# Patient Record
Sex: Male | Born: 2000 | Race: White | Hispanic: No | Marital: Single | Smoking: Current every day smoker
Health system: Southern US, Community
[De-identification: ages and names within clinical notes are randomized; demographics above are authoritative.]

## PROBLEM LIST (undated history)

## (undated) DIAGNOSIS — T7840XA Allergy, unspecified, initial encounter: Secondary | ICD-10-CM

## (undated) DIAGNOSIS — G43909 Migraine, unspecified, not intractable, without status migrainosus: Secondary | ICD-10-CM

## (undated) DIAGNOSIS — M419 Scoliosis, unspecified: Secondary | ICD-10-CM

## (undated) DIAGNOSIS — N281 Cyst of kidney, acquired: Secondary | ICD-10-CM

## (undated) DIAGNOSIS — G8929 Other chronic pain: Secondary | ICD-10-CM

## (undated) DIAGNOSIS — F329 Major depressive disorder, single episode, unspecified: Secondary | ICD-10-CM

## (undated) DIAGNOSIS — F32A Depression, unspecified: Secondary | ICD-10-CM

## (undated) DIAGNOSIS — M549 Dorsalgia, unspecified: Secondary | ICD-10-CM

## (undated) DIAGNOSIS — G43509 Persistent migraine aura without cerebral infarction, not intractable, without status migrainosus: Secondary | ICD-10-CM

## (undated) DIAGNOSIS — I10 Essential (primary) hypertension: Secondary | ICD-10-CM

## (undated) DIAGNOSIS — F419 Anxiety disorder, unspecified: Secondary | ICD-10-CM

## (undated) HISTORY — DX: Essential (primary) hypertension: I10

## (undated) HISTORY — DX: Other chronic pain: G89.29

## (undated) HISTORY — DX: Persistent migraine aura without cerebral infarction, not intractable, without status migrainosus: G43.509

## (undated) HISTORY — DX: Major depressive disorder, single episode, unspecified: F32.9

## (undated) HISTORY — DX: Scoliosis, unspecified: M41.9

## (undated) HISTORY — DX: Migraine, unspecified, not intractable, without status migrainosus: G43.909

## (undated) HISTORY — DX: Anxiety disorder, unspecified: F41.9

## (undated) HISTORY — DX: Depression, unspecified: F32.A

## (undated) HISTORY — DX: Dorsalgia, unspecified: M54.9

## (undated) HISTORY — DX: Allergy, unspecified, initial encounter: T78.40XA

## (undated) HISTORY — DX: Cyst of kidney, acquired: N28.1

---
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
## 2018-01-26 ENCOUNTER — Ambulatory Visit: Payer: Self-pay | Admitting: Family Medicine

## 2018-02-08 ENCOUNTER — Encounter: Payer: Self-pay | Admitting: Emergency Medicine

## 2018-02-08 ENCOUNTER — Other Ambulatory Visit: Payer: Self-pay

## 2018-02-08 ENCOUNTER — Emergency Department
Admission: EM | Admit: 2018-02-08 | Discharge: 2018-02-08 | Disposition: A | Discharge status: Home / Self Care | Payer: Medicaid Other | Attending: Emergency Medicine | Admitting: Emergency Medicine

## 2018-02-08 DIAGNOSIS — K047 Periapical abscess without sinus: Secondary | ICD-10-CM | POA: Diagnosis not present

## 2018-02-08 DIAGNOSIS — F17200 Nicotine dependence, unspecified, uncomplicated: Secondary | ICD-10-CM | POA: Insufficient documentation

## 2018-02-08 DIAGNOSIS — Z79899 Other long term (current) drug therapy: Secondary | ICD-10-CM | POA: Insufficient documentation

## 2018-02-08 DIAGNOSIS — I10 Essential (primary) hypertension: Secondary | ICD-10-CM | POA: Insufficient documentation

## 2018-02-08 MED ORDER — AMOXICILLIN 875 MG PO TABS: 875.0000 mg | ORAL_TABLET | Freq: Two times a day (BID) | ORAL | 0 refills | Status: DC

## 2018-02-08 NOTE — Discharge Instructions (Signed)
Follow-up with your regular doctor if not better in 3-5 days.  Use medication as prescribed.  Gargle with warm salt water.  If you are worsening please return the emergency department °

## 2018-02-08 NOTE — ED Provider Notes (Signed)
[facility] Emergency Department Provider Note  ____________________________________________   First MD Initiated Contact with Patient 02/08/18 1614     (approximate)  I have reviewed the triage vital signs and the nursing notes.   HISTORY  Chief Complaint No chief complaint on file.    HPI Troy Quinn is a 17 y.o. male presents to the emergency department complaining of a red area in the roof of the mouth.  He states he thinks he has a abscess where his tooth is coming in.  He denies any fever chills.  Denies any hot foods that may have burned the roof of his mouth.  He denies any chest pain, shortness of breath, vomiting or diarrhea  Past Medical History:  Diagnosis Date  . Allergy    seasonal  . Anxiety   . Chronic back pain   . Cyst of left kidney    patient has it checked every 6 months  . Depression   . Hypertension   . Migraine   . 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 Scoliosis deformity of spine     Patient Active Problem List   Diagnosis Date Noted  . 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 Allergic rhinitis 02/08/2017  . Insomnia 07/26/2016  . Fear of death 06/27/2016  . Abdominal pain 06/25/2016  . GERD without esophagitis 10/07/2015  . EKG abnormalities 10/07/2015  . Cyst of left kidney 07/30/2015  . 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 Midline thoracic back pain 07/30/2015    History reviewed. No pertinent surgical history.  Prior to Admission medications   Medication Sig Start Date End Date Taking? Authorizing Provider  amoxicillin (AMOXIL) 875 MG tablet Take 1 tablet (875 mg total) by mouth 2 (two) times daily. 02/08/18   Lovelee Forner, Roselyn BeringSusan W, PA-C  cyclobenzaprine (FLEXERIL) 5 MG tablet Take 1 tablet (5 mg total) by mouth every 8 (eight) hours as needed for muscle spasms. Do not drive for 8 hours after taking pill 12/23/17   Lada, Janit BernMelinda P, MD  rizatriptan (MAXALT-MLT) 10 MG  disintegrating tablet Take 1 tablet (10 mg total) by mouth as needed for migraine. One dose per 24 hours max 07/15/17   Doren CustardBoyce, Emily E, FNP  sertraline (ZOLOFT) 25 MG tablet Take 1 tablet (25 mg total) by mouth daily. 12/23/17   Kerman PasseyLada, Melinda P, MD    Allergies Prozac [fluoxetine hcl]  Family History  Problem Relation Age of Onset  . Depression Mother   . Asthma Father        exercise induced  . Depression Father   . Hypertension Father        history of   . Cancer Maternal Grandmother        cancer  . Arthritis Maternal Grandfather   . Diabetes Paternal Grandfather   . Hypertension Paternal Grandfather   . Kidney disease Brother   . Cancer Maternal Aunt        breast    Social History Social History   Tobacco Use  . Smoking status: Current Every Day Smoker    Types: E-cigarettes, Cigarettes  . Smokeless tobacco: Never Used  Substance Use Topics  . Alcohol use: No    Alcohol/week: 0.0 oz  . Drug use: No    Comment: history of     Review of Systems  Constitutional: No fever/chills Eyes: No visual changes. ENT: No sore throat.  Positive for a red tender swollen area on the roof of the  mouth Respiratory: Denies cough Genitourinary: Negative for dysuria. Musculoskeletal: Negative for back pain. Skin: Negative for rash.    ____________________________________________   PHYSICAL EXAM:  VITAL SIGNS: ED Triage Vitals  Enc Vitals Group     BP 02/08/18 1616 129/77     Pulse Rate 02/08/18 1616 91     Resp 02/08/18 1616 16     Temp 02/08/18 1616 98.6 F (37 C)     Temp Source 02/08/18 1616 Oral     SpO2 02/08/18 1616 100 %     Weight 02/08/18 1616 170 lb (77.1 kg)     Height 02/08/18 1616 6\' 1"  (1.854 m)     Head Circumference --      Peak Flow --      Pain Score 02/08/18 1621 7     Pain Loc --      Pain Edu? --      Excl. in GC? --     Constitutional: Alert and oriented. Well appearing and in no acute distress. Eyes: Conjunctivae are normal.  Head:  Atraumatic. Nose: No congestion/rhinnorhea. Mouth/Throat: Mucous membranes are moist.  There is a red swollen area in the left upper roof of the mouth adjacent to the molars.  There is no pus noted.  There is no ulcerations noted Neck: Supple, no lymphadenopathy is noted Cardiovascular: Normal rate, regular rhythm.  Heart sounds are normal Respiratory: Normal respiratory effort.  No retractions, lungs clear to auscultation GU: deferred Musculoskeletal: FROM all extremities, warm and well perfused Neurologic:  Normal speech and language.  Skin:  Skin is warm, dry and intact. No rash noted. Psychiatric: Mood and affect are normal. Speech and behavior are normal.  ____________________________________________   LABS (all labs ordered are listed, but only abnormal results are displayed)  Labs Reviewed - No data to display ____________________________________________   ____________________________________________  RADIOLOGY    ____________________________________________   PROCEDURES  Procedure(s) performed: No  Procedures    ____________________________________________   INITIAL IMPRESSION / ASSESSMENT AND PLAN / ED COURSE  Pertinent labs & imaging results that were available during my care of the patient were reviewed by me and considered in my medical decision making (see chart for details).  Patient is a 17 year old male presented emergency department complaining of redness and swelling in the refer the mouth.  On physical exam the area in the roof of the mouth is red swollen and tender.  There are no ulcerations or pustules noted.  Diagnosis is dental abscess.  Patient was given a prescription for amoxicillin 875 twice daily for 10 days.  He is to gargle with warm salt water to soothe the area.  If he is worsening he should return the emergency department.  The patient states he understands and was discharged in stable condition     As part of my medical  decision making, I reviewed the following data within the electronic MEDICAL RECORD NUMBER Nursing notes reviewed and incorporated, Notes from prior ED visits and [place] Controlled Substance Database  ____________________________________________   FINAL CLINICAL IMPRESSION(S) / ED DIAGNOSES  Final diagnoses:  Dental abscess      NEW MEDICATIONS STARTED DURING THIS VISIT:  Discharge Medication List as of 02/08/2018  4:25 PM    START taking these medications   Details  amoxicillin (AMOXIL) 875 MG tablet Take 1 tablet (875 mg total) by mouth 2 (two) times daily., Starting Wed 02/08/2018, Print         Note:  This document was prepared using Dragon voice recognition  software and may include unintentional dictation errors.    Faythe Ghee, PA-C 02/08/18 1704    Phineas Semen, MD 02/08/18 [phone]

## 2018-02-08 NOTE — ED Triage Notes (Signed)
Presents with possible area to roof of mouth

## 2018-05-24 ENCOUNTER — Telehealth: Payer: Self-pay | Admitting: Family Medicine

## 2018-05-24 NOTE — Telephone Encounter (Signed)
Called [phone] and left voice message informing pt that Dr Sherie DonLada refilled his medication however, we are asking that he give the office a call to schedule an appt for the first week in august.

## 2018-05-24 NOTE — Telephone Encounter (Signed)
Please ask patient to schedule a 6 month f/u appointment around the first week of August I'll refill his medicine Thank you

## 2018-10-03 ENCOUNTER — Encounter: Payer: Self-pay | Admitting: Urology

## 2018-10-03 ENCOUNTER — Ambulatory Visit: Payer: Medicaid Other | Admitting: Urology

## 2019-05-25 ENCOUNTER — Other Ambulatory Visit: Payer: Self-pay | Admitting: Family Medicine

## 2019-05-27 NOTE — Telephone Encounter (Signed)
Please schedule patient for follow up in the next 60 days.  

## 2019-05-28 NOTE — Telephone Encounter (Signed)
lvm to schedule appt in next 60 days

## 2021-03-15 ENCOUNTER — Encounter: Payer: Self-pay | Admitting: *Deleted

## 2021-03-15 ENCOUNTER — Emergency Department
Admission: EM | Admit: 2021-03-15 | Discharge: 2021-03-15 | Disposition: A | Discharge status: Home / Self Care | Payer: Medicaid Other | Attending: Physician Assistant | Admitting: Physician Assistant

## 2021-03-15 ENCOUNTER — Other Ambulatory Visit: Payer: Self-pay

## 2021-03-15 DIAGNOSIS — K0889 Other specified disorders of teeth and supporting structures: Secondary | ICD-10-CM | POA: Diagnosis not present

## 2021-03-15 DIAGNOSIS — F17210 Nicotine dependence, cigarettes, uncomplicated: Secondary | ICD-10-CM | POA: Diagnosis not present

## 2021-03-15 DIAGNOSIS — K0499 Other diseases of pulp and periapical tissues: Secondary | ICD-10-CM | POA: Insufficient documentation

## 2021-03-15 DIAGNOSIS — K061 Gingival enlargement: Secondary | ICD-10-CM | POA: Diagnosis not present

## 2021-03-15 DIAGNOSIS — I10 Essential (primary) hypertension: Secondary | ICD-10-CM | POA: Diagnosis not present

## 2021-03-15 MED ORDER — NAPROXEN 500 MG PO TABS: 500.0000 mg | ORAL_TABLET | Freq: Two times a day (BID) | ORAL | Status: DC

## 2021-03-15 MED ORDER — AMOXICILLIN 875 MG PO TABS: 875.0000 mg | ORAL_TABLET | Freq: Two times a day (BID) | ORAL | 0 refills | Status: DC

## 2021-03-15 NOTE — ED Provider Notes (Signed)
[facility] Emergency Department Provider Note   ____________________________________________   Event Date/Time   First MD Initiated Contact with Patient 03/15/21 1101     (approximate)  I have reviewed the triage vital signs and the nursing notes.   HISTORY  Chief Complaint Oral Swelling    HPI Troy Quinn is a 20 y.o. male patient complain of left upper molar pain and gingival edema.  Patient has a longtime history of  left upper molar pain secondary to the devitalized teeth.  Patient states 2 days ago with development gingiva edema.  No fever associated with complaint.  Rates pain as a 4/10.  Described pain as "sore".  No palliative measure for complaint.      Past Medical History:  Diagnosis Date  . Allergy    seasonal  . Anxiety   . Chronic back pain   . Cyst of left kidney    patient has it checked every 6 months  . Depression   . Hypertension   . Migraine   . 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 Scoliosis deformity of spine     Patient Active Problem List   Diagnosis Date Noted  . 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 Allergic rhinitis 02/08/2017  . Insomnia 2016-08-18  . Fear of death July 20, 2016  . Abdominal pain 06/25/2016  . GERD without esophagitis 10/07/2015  . EKG abnormalities 10/07/2015  . Cyst of left kidney 07/30/2015  . 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 Midline thoracic back pain 07/30/2015    History reviewed. No pertinent surgical history.  Prior to Admission medications   Medication Sig Start Date End Date Taking? Authorizing Provider  amoxicillin (AMOXIL) 875 MG tablet Take 1 tablet (875 mg total) by mouth 2 (two) times daily. 03/15/21  Yes Joni Reining, PA-C  naproxen (NAPROSYN) 500 MG tablet Take 1 tablet (500 mg total) by mouth 2 (two) times daily with a meal. 03/15/21  Yes Joni Reining, PA-C  amoxicillin (AMOXIL) 875 MG tablet Take 1 tablet (875  mg total) by mouth 2 (two) times daily. 02/08/18   Fisher, Roselyn Bering, PA-C  cyclobenzaprine (FLEXERIL) 5 MG tablet Take 1 tablet (5 mg total) by mouth every 8 (eight) hours as needed for muscle spasms. Do not drive for 8 hours after taking pill 12/23/17   Lada, Janit Bern, MD  rizatriptan (MAXALT-MLT) 10 MG disintegrating tablet Take 1 tablet (10 mg total) by mouth as needed for migraine. One dose per 24 hours max 07/15/17   Doren Custard, FNP  sertraline (ZOLOFT) 25 MG tablet TAKE 1 TABLET BY MOUTH EVERY DAY 05/27/19   Doren Custard, FNP    Allergies Prozac [fluoxetine hcl]  Family History  Problem Relation Age of Onset  . Depression Mother   . Asthma Father        exercise induced  . Depression Father   . Hypertension Father        history of   . Cancer Maternal Grandmother        cancer  . Arthritis Maternal Grandfather   . Diabetes Paternal Grandfather   . Hypertension Paternal Grandfather   . Kidney disease Brother   . Cancer Maternal Aunt        breast    Social History Social History   Tobacco Use  . Smoking status: Current Every Day Smoker    Types: E-cigarettes, Cigarettes  . Smokeless tobacco: Never Used  Vaping Use  .  Vaping Use: Every day  Substance Use Topics  . Alcohol use: No    Alcohol/week: 0.0 standard drinks  . Drug use: No    Comment: history of     Review of Systems Constitutional: No fever/chills Eyes: No visual changes. ENT: No sore throat.  Dental pain Cardiovascular: Denies chest pain. Respiratory: Denies shortness of breath. Gastrointestinal: No abdominal pain.  No nausea, no vomiting.  No diarrhea.  No constipation. Genitourinary: Negative for dysuria. Musculoskeletal: Negative for back pain. Skin: Negative for rash. Neurological: Negative for headaches, focal weakness or numbness. Psychiatric:  Anxiety Endocrine:  Hypertension Hematological/Lymphatic:  Allergic/Immunilogical:  Prozac  ____________________________________________   PHYSICAL EXAM:  VITAL SIGNS: ED Triage Vitals  Enc Vitals Group     BP 03/15/21 1105 (!) 154/78     Pulse Rate 03/15/21 1105 92     Resp 03/15/21 1105 16     Temp 03/15/21 1105 98.7 F (37.1 C)     Temp Source 03/15/21 1105 Oral     SpO2 03/15/21 1105 98 %     Weight 03/15/21 1106 169 lb 15.6 oz (77.1 kg)     Height 03/15/21 1106 6' (1.829 m)     Head Circumference --      Peak Flow --      Pain Score 03/15/21 1106 4     Pain Loc --      Pain Edu? --      Excl. in GC? --     Constitutional: Alert and oriented. Well appearing and in no acute distress. Mouth/Throat: Mucous membranes are moist.  Oropharynx non-erythematous.  Devitalized left upper molar.  Gingival edema. Neck: No stridor.  No cervical lymphadenopathy. Cardiovascular: Normal rate, regular rhythm. Grossly normal heart sounds.  Good peripheral circulation.  Elevated blood pressure. Respiratory: Normal respiratory effort.  No retractions. Lungs CTAB. Skin:  Skin is warm, dry and intact. No rash noted. Psychiatric: Mood and affect are normal. Speech and behavior are normal.  ____________________________________________   LABS (all labs ordered are listed, but only abnormal results are displayed)  Labs Reviewed - No data to display ____________________________________________  EKG   ____________________________________________  RADIOLOGY I, Joni Reining, personally viewed and evaluated these images (plain radiographs) as part of my medical decision making, as well as reviewing the written report by the radiologist.  ED MD interpretation:    Official radiology report(s): No results found.  ____________________________________________   PROCEDURES  Procedure(s) performed (including Critical Care):  Procedures   ____________________________________________   INITIAL IMPRESSION / ASSESSMENT AND PLAN / ED COURSE  As part of my medical  decision making, I reviewed the following data within the electronic MEDICAL RECORD NUMBER         Patient presents with left foot dental pain secondary to devitalized tooth.  Patient given discharge care instruction advised take medication as directed.  Patient advised to follow-up with dental clinics listed in discharge care instructions.      ____________________________________________   FINAL CLINICAL IMPRESSION(S) / ED DIAGNOSES  Final diagnoses:  Pain, dental     ED Discharge Orders         Ordered    amoxicillin (AMOXIL) 875 MG tablet  2 times daily        03/15/21 1114    naproxen (NAPROSYN) 500 MG tablet  2 times daily with meals        03/15/21 1114          *Please note:  Venetia Maxon was evaluated in Emergency  Department on 03/15/2021 for the symptoms described in the history of present illness. He was evaluated in the context of the global COVID-19 pandemic, which necessitated consideration that the patient might be at risk for infection with the SARS-CoV-2 virus that causes COVID-19. Institutional protocols and algorithms that pertain to the evaluation of patients at risk for COVID-19 are in a state of rapid change based on information released by regulatory bodies including the CDC and federal and state organizations. These policies and algorithms were followed during the patient's care in the ED.  Some ED evaluations and interventions may be delayed as a result of limited staffing during and the pandemic.*   Note:  This document was prepared using Dragon voice recognition software and may include unintentional dictation errors.    Joni Reining, PA-C 03/15/21 1127    Delton Prairie, MD 03/15/21 1336

## 2021-03-15 NOTE — ED Triage Notes (Signed)
Left upper dental pain x 1 month that started swelling this week. No fevers reported.

## 2021-03-15 NOTE — Discharge Instructions (Signed)
Follow discharge care instruction take medication as directed.  Advised to follow-up with dentist for definitive evaluation and treatment.  OPTIONS FOR DENTAL FOLLOW UP CARE  [place] [facility] - Local Safety Net Dental Clinics [email or web address]   [facility] [phone])  [facility] [phone])  [place] [phone] ext [phone])  [facility] [phone])  [facility] [phone]) This clinic caters to the indigent population and is on a lottery system. Location: [employer] [facility], [employer], [address] [place] Clinic Hours: Wednesdays from 6pm - 9pm, patients seen by a lottery system. For dates, call or go to [email or web address] Services: Cleanings, fillings and simple extractions. Payment Options: DENTAL WORK IS FREE OF CHARGE. Bring proof of income or support. Best way to get seen: Arrive at 5:15 pm - this is a lottery, NOT first come/first serve, so arriving earlier will not increase your chances of being seen.     [facility] [phone] Select option 1 for emergencies   Location: [facility], [place], [address] [place] Clinic Hours: No walk-ins accepted - call the day before to schedule an appointment. Check in times are 9:30 am and 1:30 pm. Services: Simple extractions, temporary fillings, pulpectomy/pulp debridement, uncomplicated abscess drainage. Payment Options: PAYMENT IS DUE AT THE TIME OF SERVICE.  Fee is usually $100-200, additional surgical procedures (e.g. abscess drainage) may be extra. Cash, checks, Visa/MasterCard accepted.  Can file Medicaid if patient is covered for dental - patient should call case worker to check. No discount for [facility] patients. Best way to get seen: MUST call the day before and get  onto the schedule. Can usually be seen the next 1-2 days. No walk-ins accepted.     [facility] [phone]   Location: [facility], [address] [place] Clinic Hours: M, W, Th, F 8am or 1:30pm, Tues 9a or 1:30 - first come/first served. Services: Simple extractions, temporary fillings, uncomplicated abscess drainage.  You do not need to be an [facility] resident. Payment Options: PAYMENT IS DUE AT THE TIME OF SERVICE. Dental insurance, otherwise sliding scale - bring proof of income or support. Depending on income and treatment needed, cost is usually $50-200. Best way to get seen: Arrive early as it is first come/first served.     [facility] [phone]   Location: [address] [place] Clinic Hours: Mon-Thu 8a-5p Services: Most basic dental services including extractions and fillings. Payment Options: PAYMENT IS DUE AT THE TIME OF SERVICE. Sliding scale, up to 50% off - bring proof if income or support. Medicaid with dental option accepted. Best way to get seen: Call to schedule an appointment, can usually be seen within 2 weeks OR they will try to see walk-ins - show up at 8a or 2p (you may have to wait).     [facility] [phone] [place] RESIDENTS ONLY   Location: [facility], [address] [place], [place] [address] Clinic Hours: By appointment only. Monday - Thursday 8am-5pm, Friday 8am-12pm Services: Cleanings, fillings, extractions. Payment Options: PAYMENT IS DUE AT THE TIME OF SERVICE. Cash, Visa or MasterCard. Sliding scale - $30 minimum per service. Best way to get seen: Come in to office, complete packet and make an appointment - need proof of income or support monies for each household member and proof of [facility] residence. Usually takes about a  month to get in.     [facility] [phone]   Location: [address], [place] Clinic Hours: Walk-in Urgent Care Dental Services are offered Monday-Friday mornings only. The numbers of emergencies accepted daily is limited to the number of providers available. Maximum 15 - Mondays, Wednesdays & Thursdays Maximum 10 - Tuesdays & Fridays Services: You do not need to be a [facility] resident to be seen for a dental emergency. Emergencies are defined as pain, swelling, abnormal bleeding, or dental trauma. Walkins will receive x-rays if needed. NOTE: Dental cleaning is not an emergency. Payment Options: PAYMENT IS DUE AT THE TIME OF SERVICE. Minimum co-pay is $40.00 for uninsured patients. Minimum co-pay is $3.00 for Medicaid with dental coverage. Dental Insurance is accepted and must be presented at time of visit. Medicare does not cover dental. Forms of payment: Cash, credit card, checks. Best way to get seen: If not previously registered with the clinic, walk-in dental registration begins at 7:15 am and is on a first come/first serve basis. If previously registered with the clinic, call to make an appointment.     [facility] [place] ONLY   Location: [address], [place], [place] Clinic Hours: Mon-Thu 10a-2p Services: Extractions only! Payment Options: FREE (donations accepted) - bring proof of income or support Best way to get seen: Call and schedule an appointment OR come at 8am on the 1st Monday of every month (except for holidays) when it is first come/first served.     [facility] [phone]   Location: [place], [place] Clinic Hours: Friday mornings Services, Payment Options, Best way to get seen: Call for info

## 2021-05-15 ENCOUNTER — Emergency Department
Admission: EM | Admit: 2021-05-15 | Discharge: 2021-05-15 | Disposition: A | Discharge status: Home / Self Care | Payer: Medicaid Other | Attending: Emergency Medicine | Admitting: Emergency Medicine

## 2021-05-15 ENCOUNTER — Other Ambulatory Visit: Payer: Self-pay

## 2021-05-15 DIAGNOSIS — F17210 Nicotine dependence, cigarettes, uncomplicated: Secondary | ICD-10-CM | POA: Diagnosis not present

## 2021-05-15 DIAGNOSIS — J00 Acute nasopharyngitis [common cold]: Secondary | ICD-10-CM | POA: Insufficient documentation

## 2021-05-15 DIAGNOSIS — I10 Essential (primary) hypertension: Secondary | ICD-10-CM | POA: Insufficient documentation

## 2021-05-15 DIAGNOSIS — R609 Edema, unspecified: Secondary | ICD-10-CM | POA: Diagnosis present

## 2021-05-15 LAB — COMPREHENSIVE METABOLIC PANEL
ALT: 18 U/L (ref 0–44)
AST: 22 U/L (ref 15–41)
Albumin: 5 g/dL (ref 3.5–5.0)
Alkaline Phosphatase: 63 U/L (ref 38–126)
Anion gap: 8 (ref 5–15)
BUN: 15 mg/dL (ref 6–20)
CO2: 28 mmol/L (ref 22–32)
Calcium: 9.7 mg/dL (ref 8.9–10.3)
Chloride: 103 mmol/L (ref 98–111)
Creatinine, Ser: 1.17 mg/dL (ref 0.61–1.24)
GFR, Estimated: >60 mL/min (ref >60)
Glucose, Bld: 115 mg/dL — ABNORMAL HIGH (ref 70–99)
Potassium: 3.8 mmol/L (ref 3.5–5.1)
Sodium: 139 mmol/L (ref 135–145)
Total Bilirubin: 0.7 mg/dL (ref 0.3–1.2)
Total Protein: 8.7 g/dL — ABNORMAL HIGH (ref 6.5–8.1)

## 2021-05-15 LAB — CBC
HCT: 43.6 % (ref 39.0–52.0)
Hemoglobin: 15.3 g/dL (ref 13.0–17.0)
MCH: 29.7 pg (ref 26.0–34.0)
MCHC: 35.1 g/dL (ref 30.0–36.0)
MCV: 84.5 fL (ref 80.0–100.0)
Platelets: 263 10*3/uL (ref 150–400)
RBC: 5.16 MIL/uL (ref 4.22–5.81)
RDW: 12.5 % (ref 11.5–15.5)
WBC: 7.3 10*3/uL (ref 4.0–10.5)
nRBC: 0 % (ref 0.0–0.2)

## 2021-05-15 MED ORDER — FLUTICASONE PROPIONATE 50 MCG/ACT NA SUSP: 2.0000 | Freq: Every day | NASAL | 0 refills | Status: AC

## 2021-05-15 MED ORDER — CETIRIZINE HCL 5 MG PO TABS: 5.0000 mg | ORAL_TABLET | Freq: Every day | ORAL | 0 refills | Status: AC

## 2021-05-15 MED ORDER — PSEUDOEPHEDRINE HCL 30 MG PO TABS: 30.0000 mg | ORAL_TABLET | Freq: Three times a day (TID) | ORAL | 0 refills | Status: AC | PRN

## 2021-05-15 NOTE — Discharge Instructions (Addendum)
Take the prescription meds as directed.  °

## 2021-05-15 NOTE — ED Provider Notes (Signed)
[facility] Emergency Department Provider Note ____________________________________________  Time seen: 1912  I have reviewed the triage vital signs and the nursing notes.  HISTORY  Chief Complaint  Oral Swelling   HPI Troy Quinn is a 20 y.o. male presents himself to the ED for evaluation of what patient is concerned may be lymph nodes.  Patient describes noted some swelling to the angle of his left jaw that he states is painful to touch.  He denies any current pain or discomfort at this moment.  He denies any associated cough, fever, chills, or sweats.  He also denies any sore throat, dental or oral infections, or scalp lesions.  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 No pertinent surgical history.  Prior to Admission medications   Medication Sig Start Date End Date Taking? Authorizing Provider  cetirizine (ZYRTEC) 5 MG tablet Take 1 tablet (5 mg total) by mouth daily. 05/15/21 06/14/21 Yes Geoff Dacanay, Charlesetta Ivory, PA-C  fluticasone (FLONASE) 50 MCG/ACT nasal spray Place 2 sprays into both nostrils daily. 05/15/21  Yes Jahfari Ambers, Charlesetta Ivory, PA-C  pseudoephedrine (SUDAFED) 30 MG tablet Take 1 tablet (30 mg total) by mouth every 8 (eight) hours as needed for congestion. 05/15/21   Yes Hezakiah Champeau, Charlesetta Ivory, PA-C  sertraline (ZOLOFT) 25 MG tablet TAKE 1 TABLET BY MOUTH EVERY DAY 05/27/19   Doren Custard, FNP    Allergies Prozac [fluoxetine hcl]  Family History  Problem Relation Age of Onset   Depression Mother    Asthma Father        exercise induced   Depression Father    Hypertension Father        history of    Cancer Maternal Grandmother        cancer   Arthritis Maternal Grandfather    Diabetes Paternal Grandfather    Hypertension Paternal Grandfather    Kidney disease Brother    Cancer Maternal Aunt        breast    Social History Social History   Tobacco Use   Smoking status: Every Day    Pack years: 0.00    Types: E-cigarettes, Cigarettes   Smokeless tobacco: Never  Vaping Use   Vaping Use: Every day  Substance Use Topics   Alcohol use: No    Alcohol/week: 0.0 standard drinks   Drug use: No    Comment: history of     Review of Systems  Constitutional: Negative for fever. Eyes: Negative for visual changes. ENT: Negative for sore throat. Cardiovascular: Negative for chest pain. Respiratory: Negative for shortness of breath. Gastrointestinal: Negative for abdominal pain, vomiting and diarrhea. Genitourinary: Negative for dysuria. Musculoskeletal: Negative for back pain. Skin:  Negative for rash.  Questionable lymph nodes as above. Neurological: Negative for headaches, focal weakness or numbness. ____________________________________________  PHYSICAL EXAM:  VITAL SIGNS: ED Triage Vitals [05/15/21 1647]  Enc Vitals Group     BP (!) 146/88     Pulse Rate (!) 102     Resp 18     Temp 98.2 F (36.8 C)     Temp Source Oral     SpO2 100 %     Weight 177 lb (80.3 kg)     Height 6\' 1"  (1.854 m)     Head Circumference      Peak Flow      Pain Score 0     Pain Loc      Pain Edu?      Excl. in GC?     Constitutional: Alert and oriented. Well appearing and in no distress. Head: Normocephalic and atraumatic. Eyes:  Conjunctivae are normal. PERRL. Normal extraocular movements Ears: Canals clear. TMs intact bilaterally.  Mild serous effusion noted on the left. Nose: No congestion/rhinorrhea/epistaxis. Mouth/Throat: Mucous membranes are moist.  No oral lesions appreciated.  Patient with some localized fullness to this lingual region, likely related to sublingual salivary glands. Neck: Supple. No thyromegaly. Hematological/Lymphatic/Immunological: No cervical lymphadenopathy. Cardiovascular: Normal rate, regular rhythm. Normal distal pulses. Respiratory: Normal respiratory effort.  Musculoskeletal: Nontender with normal range of motion in all extremities.  Neurologic:  Normal gait without ataxia. Normal speech and language. No gross focal neurologic deficits are appreciated. Skin:  Skin is warm, dry and intact. No rash noted. Psychiatric: Mood and affect are normal. Patient exhibits appropriate insight and judgment. ____________________________________________   LABS (pertinent positives/negatives) Labs Reviewed  COMPREHENSIVE METABOLIC PANEL - Abnormal; Notable for the following components:      Result Value   Glucose, Bld 115 (*)    Total Protein 8.7 (*)    All other components within normal limits  CBC  ____________________________________________  PROCEDURES  Procedures ____________________________________________   INITIAL IMPRESSION / ASSESSMENT AND PLAN / ED COURSE  As part of my medical decision making, I reviewed the following data within the electronic MEDICAL RECORD NUMBER   DDX: lymphadenopathy, parotiditis, TMJ, AOM  Patient ED evaluation of concern for possible palpable lymph nodes.  Patient exam is overall nonreturn to time.  No indication of lymphadenopathy on exam.  The areas of concern likely represent his sublingual salivary glands, and left angle of the jaw, likely some fullness from his eustachian tube given he has a left serous effusion.  No other signs of any acute infectious  process at this time.  Patient is overall reassured by his normal exam and labs.  He will be treated empirically for his sinusitis/rhinitis, serous effusion with prescriptions for nasal spray, cetirizine, and pseudoephedrine.  He will follow-up with his primary provider or return to the ED if needed.  Troy Quinn was evaluated in Emergency Department on 05/15/2021 for the symptoms described in the history of present illness. He was evaluated in the context of the global COVID-19 pandemic, which necessitated consideration that the patient might be at risk for infection with the SARS-CoV-2 virus that causes COVID-19. Institutional protocols and algorithms that pertain to the evaluation of patients at risk for COVID-19 are in a state of rapid change based on information released by regulatory bodies including the CDC and federal and state organizations. These policies and algorithms were followed during the patient's care in the ED. ____________________________________________  FINAL CLINICAL IMPRESSION(S) / ED DIAGNOSES  Final diagnoses:  Acute rhinitis      Eshika Reckart, Charlesetta Ivory, PA-C 05/16/21 0002    Sharyn Creamer, MD 05/17/21 1431

## 2021-05-15 NOTE — ED Triage Notes (Signed)
Pt states he has noticed some swelling in his lymph nodes- pt states painful to touch- pt denies cough, fevers, chills- pt states he has pain in his throat in the mornings but it usually goes away

## 2023-06-26 ENCOUNTER — Emergency Department: Payer: Medicaid Other

## 2023-06-26 ENCOUNTER — Emergency Department
Admission: EM | Admit: 2023-06-26 | Discharge: 2023-06-26 | Disposition: A | Discharge status: Home / Self Care | Payer: Medicaid Other | Attending: Student in an Organized Health Care Education/Training Program | Admitting: Student in an Organized Health Care Education/Training Program

## 2023-06-26 ENCOUNTER — Other Ambulatory Visit: Payer: Self-pay

## 2023-06-26 ENCOUNTER — Encounter: Payer: Self-pay | Admitting: Emergency Medicine

## 2023-06-26 DIAGNOSIS — M25571 Pain in right ankle and joints of right foot: Secondary | ICD-10-CM | POA: Diagnosis present

## 2023-06-26 DIAGNOSIS — X501XXA Overexertion from prolonged static or awkward postures, initial encounter: Secondary | ICD-10-CM | POA: Insufficient documentation

## 2023-06-26 NOTE — Discharge Instructions (Addendum)
You were seen in the Emergency Department for ankle sprain. Your x-ray showed no fractures or dislocations. If you continue to have pain you might need to seek more medical attention and follow up with your primary care doctor or an orthopedist as needed.Your x-ray did not show any evidence of fracture; it is likely that you sprained your ankle. -- Use ice for comfort for 30 minutes 4 or 5 times a day to help reduce pain and swelling. -- Keep your ankle elevated to reduce swelling. -- You may bear weight as tolerated. -- You can use crutches and cane for comfort. -- Take Tylenol or Ibuprofen for pain. -- You can use an ace wrap for comfort as well. -- Please follow up with your primary care doctor or orthopedics as needed.

## 2023-06-26 NOTE — ED Provider Notes (Signed)
[facility] Provider Note    Event Date/Time   First MD Initiated Contact with Patient 06/26/23 [phone]     (approximate)   History   Ankle Pain   HPI  Troy Quinn is a 22 y.o. male with a past medical history of anxiety who presents today for evaluation of right ankle pain.  Patient reports that he slipped on mud this morning and inverted his right ankle.  He denies fall to the ground, reports he is able to catch himself.  He reports that he has pain with ambulation but that he is able to ambulate.  He denies paresthesias.  No other injury sustained.  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/2015   Migraine aura, persistent 07/30/2015   Midline thoracic back pain 07/30/2015          Physical Exam   Triage Vital Signs: ED Triage Vitals  Encounter Vitals Group     BP 06/26/23 0820 (!) 145/98     Systolic BP Percentile --      Diastolic BP Percentile --      Pulse Rate 06/26/23 0820 97     Resp 06/26/23 0820 18     Temp 06/26/23 0820 99.3 F (37.4 C)     Temp Source 06/26/23 0820 Oral     SpO2 06/26/23 0820 100 %     Weight 06/26/23 0821 178 lb (80.7 kg)     Height 06/26/23 0821 6\' 1"  (1.854 m)     Head Circumference --      Peak Flow --      Pain Score 06/26/23 0821 7     Pain Loc --      Pain Education --      Exclude from Growth Chart --     Most recent vital signs: Vitals:   06/26/23 0820  BP: (!) 145/98  Pulse: 97  Resp: 18  Temp: 99.3 F (37.4 C)  SpO2: 100%    Physical Exam Vitals and nursing note reviewed.  Constitutional:      General: Awake and alert. No acute distress.    Appearance: Normal appearance. The patient  is normal weight.  HENT:     Head: Normocephalic and atraumatic.     Mouth: Mucous membranes are moist.  Eyes:     General: PERRL. Normal EOMs        Right eye: No discharge.        Left eye: No discharge.     Conjunctiva/sclera: Conjunctivae normal.  Cardiovascular:     Rate and Rhythm: Normal rate and regular rhythm.     Pulses: Normal pulses.  Pulmonary:     Effort: Pulmonary effort is normal. No respiratory distress.     Breath sounds: Normal breath sounds.  Abdominal:     Abdomen is soft. There is no abdominal tenderness. No rebound or guarding. No distention. Musculoskeletal:        General: No swelling. Normal range of motion.     Cervical back: Normal range of motion and neck supple.  Right ankle: Tenderness and swelling over the anterior talofibular ligament and posterior to lateral malleolus, no lateral or medial malleolar tenderness or proximal fifth  metacarpal tenderness. No proximal fibular tenderness. 2+ pedal pulses with brisk capillary refill. Intact distal sensation and strength with normal ROM. Able to plantar flex and dorsiflex against resistance. Able to invert and evert against resistance. Negative  dorsiflexion external rotation test. Negative squeeze test. Negative Thompson test Skin:    General: Skin is warm and dry.     Capillary Refill: Capillary refill takes less than 2 seconds.     Findings: No rash.  Neurological:     Mental Status: The patient is awake and alert.      ED Results / Procedures / Treatments   Labs (all labs ordered are listed, but only abnormal results are displayed) Labs Reviewed - No data to display   EKG     RADIOLOGY I independently reviewed and interpreted imaging and agree with radiologists findings.     PROCEDURES:  Critical Care performed:   Procedures   MEDICATIONS ORDERED IN ED: Medications - No data to display   IMPRESSION / MDM / ASSESSMENT AND PLAN / ED COURSE  I reviewed the triage vital signs and  the nursing notes.   Differential diagnosis includes, but is not limited to, fracture, sprain, contusion. Patient has swelling and tenderness to palpation over lateral malleolus and ATFL.  Therefore per [place] ankle rules x-ray was obtained.  There is no evidence of fracture on x-ray.  There is no tenderness to proximal fibula that would be concerning for occult fracture.  There is no knee pain or swelling and no ligamental laxity, do not suspect knee injury.  There is no proximal fifth metatarsal tenderness concerning for Jones fracture.  Negative squeeze test so do not suspect high ankle sprain.  Negative Thompson test, do not suspect Achilles tendon rupture. Patient is able to bear weight with pain.  Patient was given crutches and ankle splint.  We discussed [place] and orthopedic follow-up.  Patient was treated symptomatically in the emergency department with improvement of symptoms.  We discussed no sports until ankle heals.  Patient understands and agrees with plan.   Patient's presentation is most consistent with acute complicated illness / injury requiring diagnostic workup.      FINAL CLINICAL IMPRESSION(S) / ED DIAGNOSES   Final diagnoses:  Acute right ankle pain     Rx / DC Orders   ED Discharge Orders     None        Note:  This document was prepared using Dragon voice recognition software and may include unintentional dictation errors.   Keturah Shavers 06/26/23 1410    Willy Eddy, MD 06/26/23 1538

## 2023-06-26 NOTE — ED Triage Notes (Signed)
Pt states last night he slid on mud and now his right ankle is causing him pain and unable to walk on it.
